# Patient Record
Sex: Male | Born: 1995 | Race: White | Hispanic: No | Marital: Single | Smoking: Never smoker
Health system: Southern US, Community
[De-identification: ages and names within clinical notes are randomized; demographics above are authoritative.]

## PROBLEM LIST (undated history)

## (undated) DIAGNOSIS — F32A Depression, unspecified: Secondary | ICD-10-CM

## (undated) DIAGNOSIS — F909 Attention-deficit hyperactivity disorder, unspecified type: Secondary | ICD-10-CM

---

## 2020-09-17 ENCOUNTER — Other Ambulatory Visit: Payer: Self-pay

## 2020-09-17 ENCOUNTER — Emergency Department (HOSPITAL_BASED_OUTPATIENT_CLINIC_OR_DEPARTMENT_OTHER)
Admission: EM | Admit: 2020-09-17 | Discharge: 2020-09-17 | Disposition: A | Discharge status: Home / Self Care | Payer: Medicaid Other | Attending: Emergency Medicine | Admitting: Emergency Medicine

## 2020-09-17 ENCOUNTER — Other Ambulatory Visit (HOSPITAL_BASED_OUTPATIENT_CLINIC_OR_DEPARTMENT_OTHER): Payer: Self-pay | Admitting: Emergency Medicine

## 2020-09-17 ENCOUNTER — Encounter (HOSPITAL_BASED_OUTPATIENT_CLINIC_OR_DEPARTMENT_OTHER): Payer: Self-pay | Admitting: Emergency Medicine

## 2020-09-17 ENCOUNTER — Emergency Department (HOSPITAL_BASED_OUTPATIENT_CLINIC_OR_DEPARTMENT_OTHER): Payer: Medicaid Other

## 2020-09-17 DIAGNOSIS — N200 Calculus of kidney: Secondary | ICD-10-CM | POA: Diagnosis not present

## 2020-09-17 DIAGNOSIS — R109 Unspecified abdominal pain: Secondary | ICD-10-CM | POA: Diagnosis present

## 2020-09-17 HISTORY — DX: Depression, unspecified: F32.A

## 2020-09-17 HISTORY — DX: Attention-deficit hyperactivity disorder, unspecified type: F90.9

## 2020-09-17 LAB — CBC WITH DIFFERENTIAL/PLATELET
Abs Immature Granulocytes: 0.03 10*3/uL (ref 0.00–0.07)
Basophils Absolute: 0 10*3/uL (ref 0.0–0.1)
Basophils Relative: 0 %
Eosinophils Absolute: 0.1 10*3/uL (ref 0.0–0.5)
Eosinophils Relative: 1 %
HCT: 39.5 % (ref 39.0–52.0)
Hemoglobin: 14.1 g/dL (ref 13.0–17.0)
Immature Granulocytes: 0 %
Lymphocytes Relative: 13 %
Lymphs Abs: 1.2 10*3/uL (ref 0.7–4.0)
MCH: 31 pg (ref 26.0–34.0)
MCHC: 35.7 g/dL (ref 30.0–36.0)
MCV: 86.8 fL (ref 80.0–100.0)
Monocytes Absolute: 0.6 10*3/uL (ref 0.1–1.0)
Monocytes Relative: 6 %
Neutro Abs: 6.8 10*3/uL (ref 1.7–7.7)
Neutrophils Relative %: 80 %
Platelets: 250 10*3/uL (ref 150–400)
RBC: 4.55 MIL/uL (ref 4.22–5.81)
RDW: 11.9 % (ref 11.5–15.5)
WBC: 8.6 10*3/uL (ref 4.0–10.5)
nRBC: 0 % (ref 0.0–0.2)

## 2020-09-17 LAB — BASIC METABOLIC PANEL
Anion gap: 11 (ref 5–15)
BUN: 10 mg/dL (ref 6–20)
CO2: 22 mmol/L (ref 22–32)
Calcium: 8.8 mg/dL — ABNORMAL LOW (ref 8.9–10.3)
Chloride: 105 mmol/L (ref 98–111)
Creatinine, Ser: 0.95 mg/dL (ref 0.61–1.24)
GFR, Estimated: >60 mL/min (ref >60)
Glucose, Bld: 151 mg/dL — ABNORMAL HIGH (ref 70–99)
Potassium: 3.3 mmol/L — ABNORMAL LOW (ref 3.5–5.1)
Sodium: 138 mmol/L (ref 135–145)

## 2020-09-17 LAB — URINALYSIS, MICROSCOPIC (REFLEX)

## 2020-09-17 LAB — URINALYSIS, ROUTINE W REFLEX MICROSCOPIC
Bilirubin Urine: NEGATIVE
Glucose, UA: NEGATIVE mg/dL
Ketones, ur: 40 mg/dL — AB
Leukocytes,Ua: NEGATIVE
Nitrite: NEGATIVE
Protein, ur: NEGATIVE mg/dL
Specific Gravity, Urine: >1.03 — ABNORMAL HIGH (ref 1.005–1.030)
pH: 6 (ref 5.0–8.0)

## 2020-09-17 MED ORDER — MORPHINE SULFATE (PF) 4 MG/ML IV SOLN
4.0000 mg | Freq: Once | INTRAVENOUS | Status: AC
  Administered 2020-09-17: 4 mg via INTRAVENOUS
  Filled 2020-09-17: qty 1

## 2020-09-17 MED ORDER — KETOROLAC TROMETHAMINE 30 MG/ML IJ SOLN
30.0000 mg | Freq: Once | INTRAMUSCULAR | Status: AC
  Administered 2020-09-17: 30 mg via INTRAVENOUS
  Filled 2020-09-17: qty 1

## 2020-09-17 MED ORDER — OXYCODONE-ACETAMINOPHEN 5-325 MG PO TABS
1.0000 | ORAL_TABLET | Freq: Four times a day (QID) | ORAL | 0 refills | Status: DC | PRN
Start: 2020-09-17 — End: 2020-09-17

## 2020-09-17 MED ORDER — ONDANSETRON HCL 4 MG/2ML IJ SOLN
4.0000 mg | Freq: Once | INTRAMUSCULAR | Status: AC
  Administered 2020-09-17: 4 mg via INTRAVENOUS
  Filled 2020-09-17: qty 2

## 2020-09-17 MED FILL — OXYCODONE-ACETAMINOPHEN 5-3: 5-325 | 2 days supply | Qty: 15 | Fill #0

## 2020-09-17 NOTE — ED Provider Notes (Signed)
[facility] EMERGENCY DEPARTMENT Provider Note   CSN: [id number] Arrival date & time: 09/17/20  2229     History Chief Complaint  Patient presents with  . Flank Pain    Justin Chung is a 24 y.o. male.  The history is provided by the patient.  Flank Pain This is a new problem. The current episode started 1 to 2 hours ago. The problem occurs constantly. The problem has been rapidly worsening. Associated symptoms include abdominal pain. Nothing aggravates the symptoms. Nothing relieves the symptoms. He has tried nothing for the symptoms.       Past Medical History:  Diagnosis Date  . ADHD   . Depression     There are no problems to display for this patient.        No family history on file.  Social History   Tobacco Use  . Smoking status: Never Smoker  . Smokeless tobacco: Never Used  Substance Use Topics  . Alcohol use: Not on file  . Drug use: Not on file    Home Medications Prior to Admission medications   Not on File    Allergies    Patient has no known allergies.  Review of Systems   Review of Systems  Gastrointestinal: Positive for abdominal pain.  Genitourinary: Positive for flank pain.  All other systems reviewed and are negative.   Physical Exam Updated Vital Signs BP 128/83 (BP Location: Left Arm)   Pulse 68   Temp 97.6 F (36.4 C) (Oral)   Resp 16   Ht 5\' 10"  (1.778 m)   Wt 88.9 kg   SpO2 99%   BMI 28.12 kg/m   Physical Exam Vitals and nursing note reviewed.  Constitutional:      General: He is not in acute distress.    Appearance: Normal appearance. He is well-developed. He is not diaphoretic.  HENT:     Head: Normocephalic and atraumatic.  Cardiovascular:     Rate and Rhythm: Normal rate and regular rhythm.     Heart sounds: No murmur heard.  No friction rub.  Pulmonary:     Effort: Pulmonary effort is normal. No respiratory distress.     Breath sounds: Normal breath sounds. No wheezing or rales.    Abdominal:     General: Bowel sounds are normal. There is no distension.     Palpations: Abdomen is soft.     Tenderness: There is abdominal tenderness. There is left CVA tenderness. There is no guarding or rebound.     Comments: There is tenderness to palpation in the left flank.  Musculoskeletal:        General: Normal range of motion.     Cervical back: Normal range of motion and neck supple.  Skin:    General: Skin is warm and dry.  Neurological:     Mental Status: He is alert and oriented to person, place, and time.     Coordination: Coordination normal.     ED Results / Procedures / Treatments   Labs (all labs ordered are listed, but only abnormal results are displayed) Labs Reviewed  CBC WITH DIFFERENTIAL/PLATELET  BASIC METABOLIC PANEL  URINALYSIS, ROUTINE W REFLEX MICROSCOPIC    EKG None  Radiology No results found.  Procedures Procedures (including critical care time)  Medications Ordered in ED Medications  ondansetron (ZOFRAN) injection 4 mg (4 mg Intravenous Given 09/17/20 1006)  ketorolac (TORADOL) 30 MG/ML injection 30 mg (30 mg Intravenous Given 09/17/20 1006)    ED Course  I have reviewed the triage vital signs and the nursing notes.  Pertinent labs & imaging results that were available during my care of the patient were reviewed by me and considered in my medical decision making (see chart for details).    MDM Rules/Calculators/A&P  Patient is a 24 year old male presenting with complaints of severe left flank pain related to a 2 mm calculus located at the left UVJ.  Patient feeling better after receiving medicines here in the ER.  There is no fever, no white count, and no evidence for infection in his urine.  At this point, patient appears appropriate for discharge.  He will be given pain medication and is advised to follow-up with urology if not improving in the next 3 to 4 days.  Final Clinical Impression(s) / ED Diagnoses Final diagnoses:  None     Rx / DC Orders ED Discharge Orders    None       Geoffery Lyons, MD 09/17/20 1307

## 2020-09-17 NOTE — ED Triage Notes (Signed)
Per EMS:  Left flank pain since this am.  Difficulty with urination, some burning.  Pt had some nausea, took zofran.  Pain increasing and called ambulance.  Pt given 100 fentanyl.

## 2020-09-17 NOTE — Discharge Instructions (Addendum)
Begin taking Percocet as prescribed.  Follow-up with [facility] if your symptoms or not improving in the next few days, and return to the ER if you develop high fever, worsening pain, or other new and concerning symptoms.

## 2021-05-30 IMAGING — CT CT RENAL STONE PROTOCOL
2 of 4 series · 16 of 46 positions shown, 18 images · non-contrast
Comparison: None.

CLINICAL DATA: Flank pain, kidney stones suspected

EXAM:
CT ABDOMEN AND PELVIS WITHOUT CONTRAST
TECHNIQUE: Multidetector CT imaging of the abdomen and pelvis was performed
following the standard protocol without IV contrast.

[Series 2: axial st · axial · 0.88mm/px · z∈[-660,-150]mm · 13 of 112 slices shown, 15 images]
[im 5/112  soft-tissue]
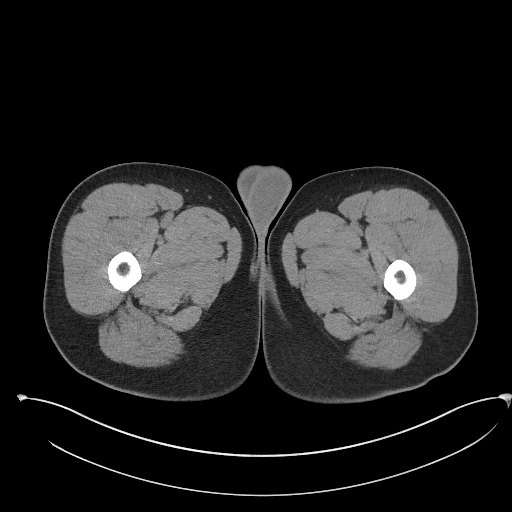
[im 5/112  bone]
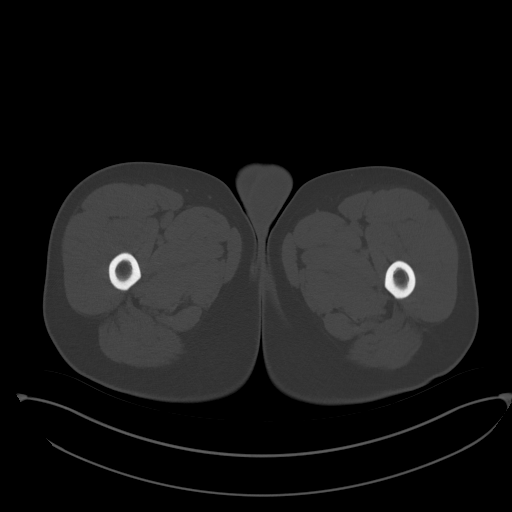
[im 14/112  soft-tissue]
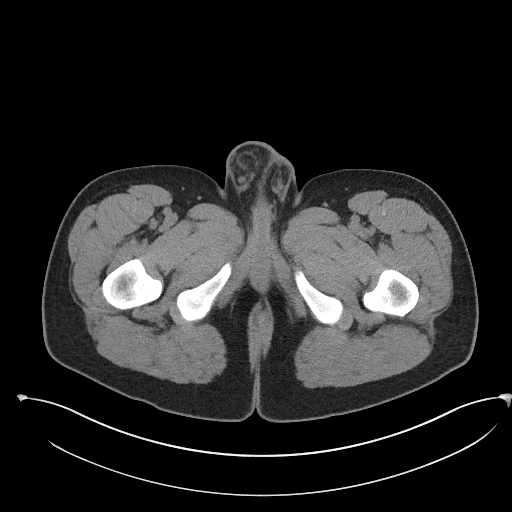
[im 24/112  soft-tissue]
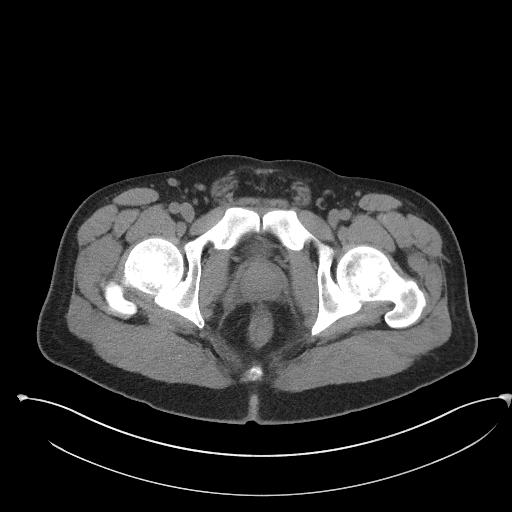
[im 33/112  soft-tissue]
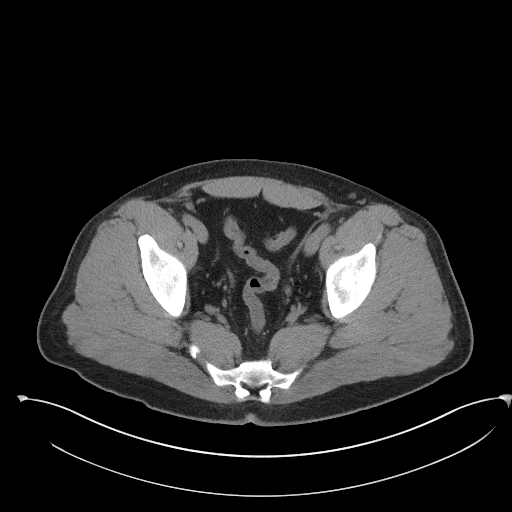
[im 38/112  soft-tissue]
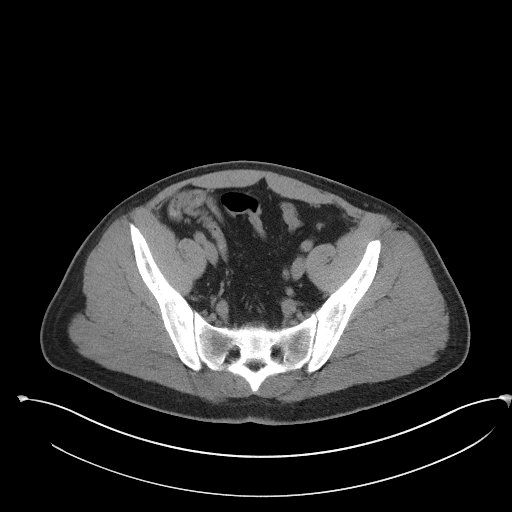
[im 47/112  soft-tissue]
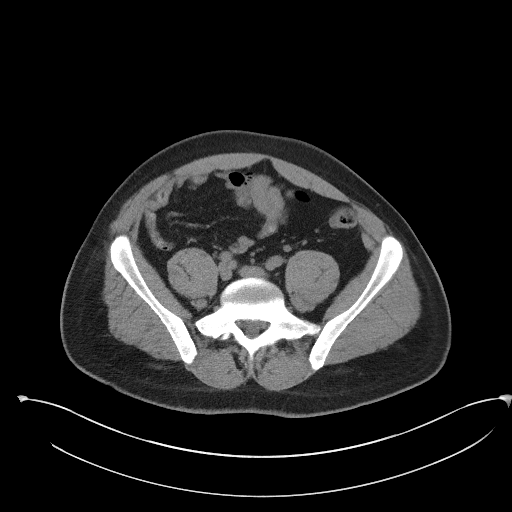
[im 56/112  soft-tissue]
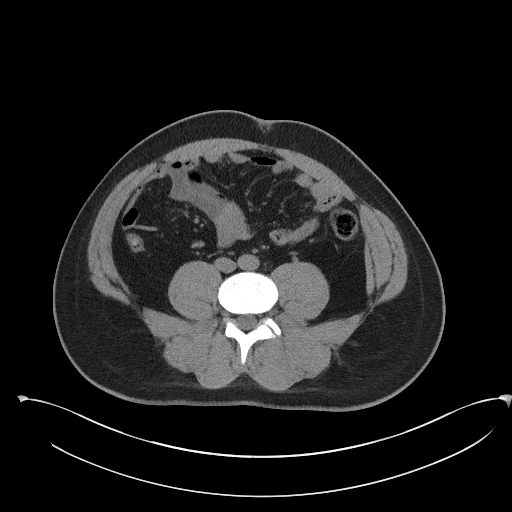
[im 65/112  soft-tissue]
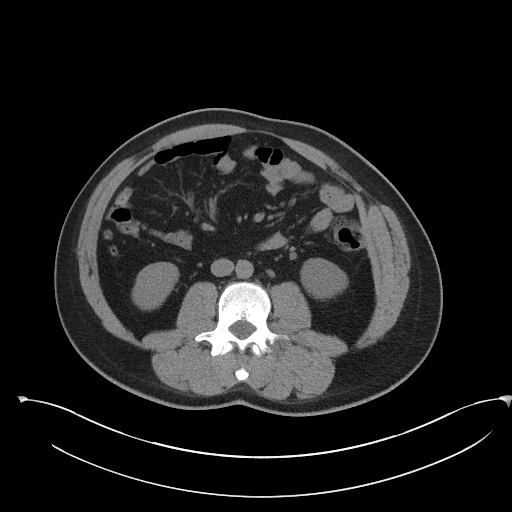
[im 75/112  soft-tissue]
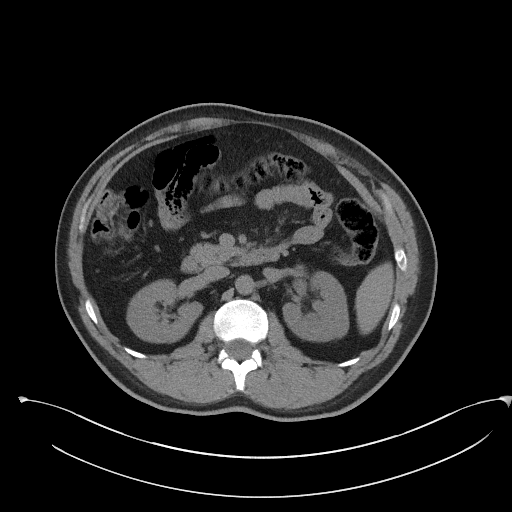
[im 75/112  bone]
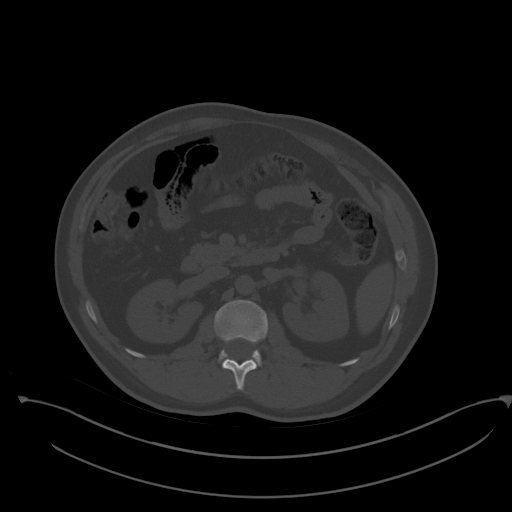
[im 79/112  soft-tissue]
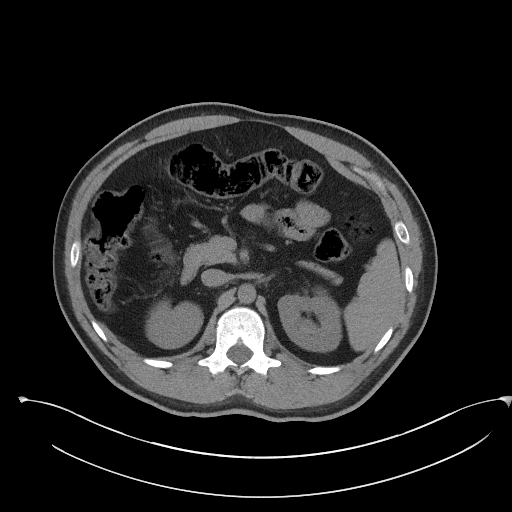
[im 88/112  soft-tissue]
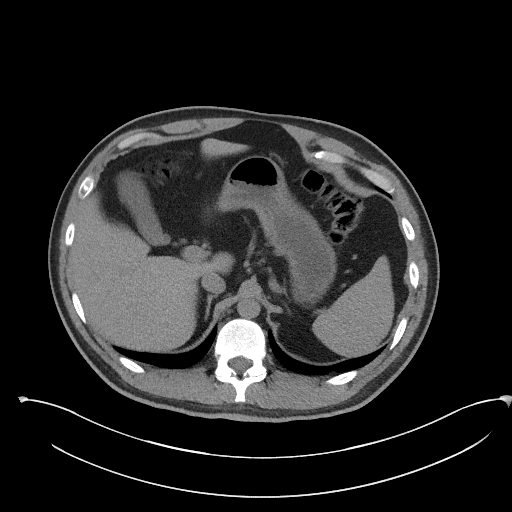
[im 98/112  soft-tissue]
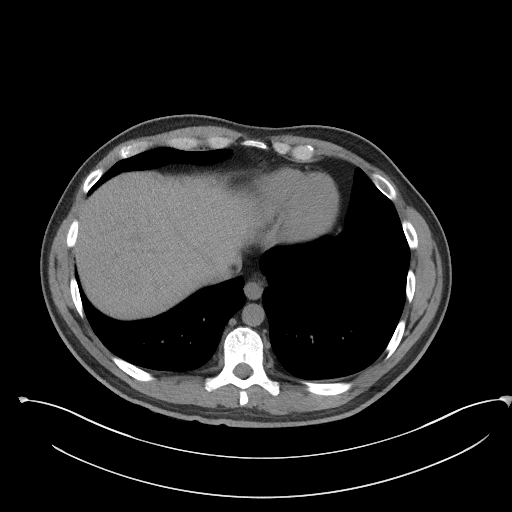
[im 107/112  soft-tissue]
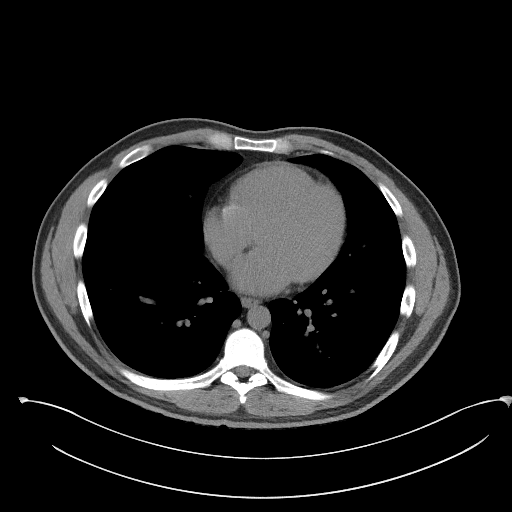

[Series 5: coronal st · coronal · 0.80mm/px · 3 of 96 slices shown]
[im 32/96  soft-tissue]
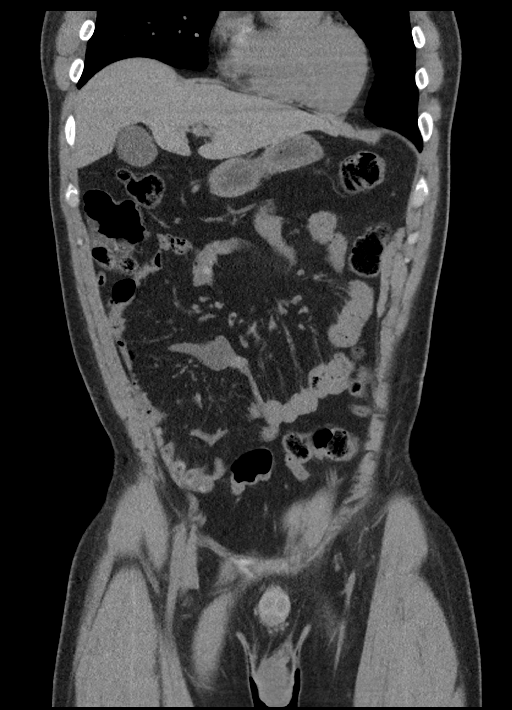
[im 43/96  soft-tissue]
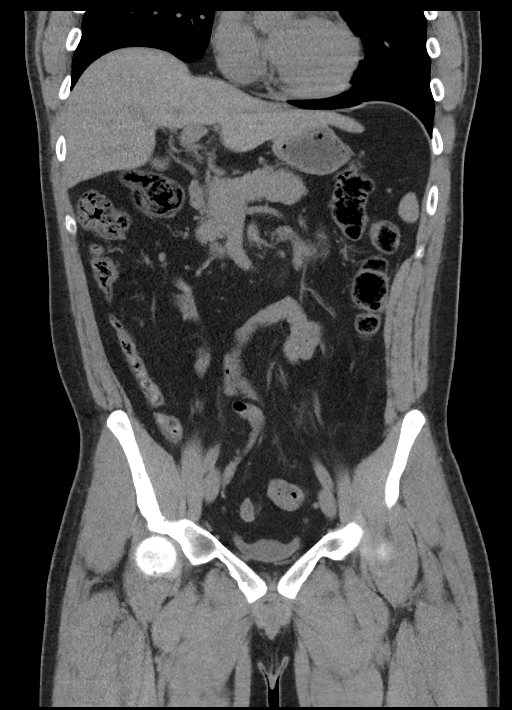
[im 53/96  soft-tissue]
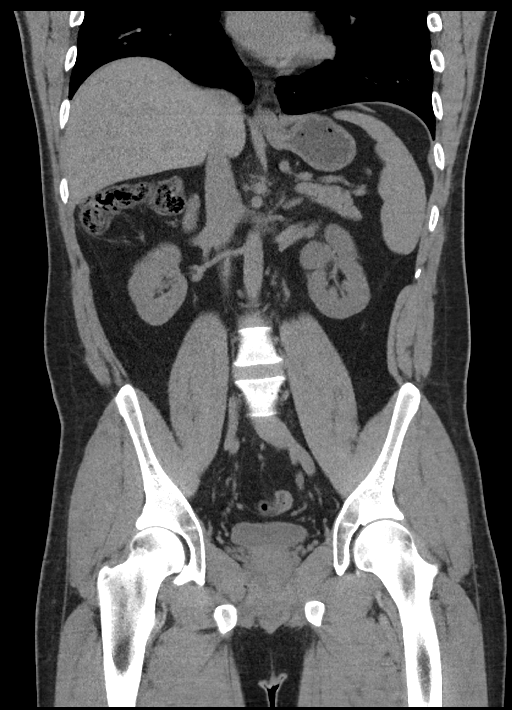

[16 of 46 positions shown; findings below may reference images not displayed]

FINDINGS: Lower chest: No acute abnormality.

Hepatobiliary: No solid liver abnormality is seen. No gallstones,
gallbladder wall thickening, or biliary dilatation.

Pancreas: Unremarkable. No pancreatic ductal dilatation or
surrounding inflammatory changes.

Spleen: Normal in size without significant abnormality.

Adrenals/Urinary Tract: Adrenal glands are unremarkable. There is a
punctuate calculus in the most distal left ureter measuring no
greater than 2 mm, with mild associated left hydronephrosis and
hydroureter. There is an additional small nonobstructive calculus of
the inferior pole of the right kidney. Bladder is unremarkable.

Stomach/Bowel: Stomach is within normal limits. Appendix appears
normal. No evidence of bowel wall thickening, distention, or
inflammatory changes.

Vascular/Lymphatic: No significant vascular findings are present. No
enlarged abdominal or pelvic lymph nodes.

Reproductive: No mass or other significant abnormality.

Other: No abdominal wall hernia or abnormality. No abdominopelvic
ascites.

Musculoskeletal: No acute or significant osseous findings.
IMPRESSION: 1. There is a punctuate calculus in the most distal left ureter
measuring no greater than 2 mm, with mild associated left
hydronephrosis and hydroureter.
2. There is an additional small nonobstructive calculus of the
inferior pole of the right kidney.
